# Patient Record
Sex: Female | Born: 1970 | Race: White | Hispanic: No | Marital: Married | Smoking: Never smoker
Health system: Southern US, Community
[De-identification: ages and names within clinical notes are randomized; demographics above are authoritative.]

## PROBLEM LIST (undated history)

## (undated) DIAGNOSIS — G43909 Migraine, unspecified, not intractable, without status migrainosus: Secondary | ICD-10-CM

## (undated) DIAGNOSIS — I10 Essential (primary) hypertension: Secondary | ICD-10-CM

## (undated) HISTORY — PX: ABDOMINAL HYSTERECTOMY: SHX81

---

## 2021-02-17 ENCOUNTER — Other Ambulatory Visit: Payer: Self-pay

## 2021-02-17 ENCOUNTER — Ambulatory Visit
Admission: EM | Admit: 2021-02-17 | Discharge: 2021-02-17 | Disposition: A | Discharge status: Home / Self Care | Payer: BC Managed Care – PPO | Attending: Sports Medicine | Admitting: Sports Medicine

## 2021-02-17 DIAGNOSIS — R0981 Nasal congestion: Secondary | ICD-10-CM

## 2021-02-17 DIAGNOSIS — R519 Headache, unspecified: Secondary | ICD-10-CM

## 2021-02-17 DIAGNOSIS — R059 Cough, unspecified: Secondary | ICD-10-CM | POA: Diagnosis not present

## 2021-02-17 DIAGNOSIS — J069 Acute upper respiratory infection, unspecified: Secondary | ICD-10-CM

## 2021-02-17 DIAGNOSIS — J301 Allergic rhinitis due to pollen: Secondary | ICD-10-CM

## 2021-02-17 DIAGNOSIS — J3489 Other specified disorders of nose and nasal sinuses: Secondary | ICD-10-CM

## 2021-02-17 DIAGNOSIS — M791 Myalgia, unspecified site: Secondary | ICD-10-CM

## 2021-02-17 HISTORY — DX: Migraine, unspecified, not intractable, without status migrainosus: G43.909

## 2021-02-17 HISTORY — DX: Essential (primary) hypertension: I10

## 2021-02-17 MED ORDER — FLUTICASONE PROPIONATE 50 MCG/ACT NA SUSP: 1.0000 | Freq: Every day | NASAL | 2 refills | Status: DC

## 2021-02-17 NOTE — ED Triage Notes (Signed)
Pt reports having headache, runny nose, sinus pressure and cough since Tuesday. Neg home covid test this morning.  Tried using otc meds without relief. Hx of seasonal allergies.

## 2021-02-17 NOTE — Discharge Instructions (Signed)
You have a viral upper respiratory infection.  You do not meet the criteria for a sinus infection.  Complicating your situation is that you have seasonal allergies that are certainly worse at this time of the year. I sent in a nasal steroid to your pharmacy.  I want you to start taking an over-the-counter allergy medication such as Zyrtec, Claritin, or Allegra on a regular basis especially for the next 4 to 6 weeks. Continue with the Nettie pot and saline flushes. I have provided some educational handouts for you to read. I also provided a work note keeping you off today and the weekend. If your symptoms do not improve and you are symptomatic over the next 10 to 14 days there may be an indication to give you an antibiotic.  You are welcome to return here or see your primary care provider for reevaluation.  I hope you get to feeling better, Dr. Zachery Dauer

## 2021-02-19 NOTE — ED Provider Notes (Signed)
[facility]    CSN: [id number] Arrival date & time: 02/17/21  1209      History   Chief Complaint Chief Complaint  Patient presents with  . Facial Pain    HPI Laurie Aguilar is a 50 y.o. female.   Patient is a pleasant 50 year old female who presents for evaluation of the above issue.  She reports 3 days of sinus pressure, facial pain, nasal congestion, rhinorrhea, productive cough, sore throat with coughing, myalgias, chills, and sweats.  No documented fevers.  She does have a history of seasonal allergies.  She has been using over-the-counter decongestion for this.  She is also been using Mucinex.  She did get her flu shot.  She has been vaccinated against COVID and has received the booster.  She did a home test today and it was negative.  She does not want a repeat test in the office.  She denies any nausea vomiting diarrhea.  No urinary or abdominal symptoms.  She denies any chest pain or shortness of breath.  She works at [employer] as a Production designer, theatre/television/film.  Her primary care physician is in [place] at [place].  No red flag signs or symptoms elicited on history.     Past Medical History:  Diagnosis Date  . Hypertension   . Migraine     There are no problems to display for this patient.   Past Surgical History:  Procedure Laterality Date  . ABDOMINAL HYSTERECTOMY      OB History   No obstetric history on file.      Home Medications    Prior to Admission medications   Medication Sig Start Date End Date Taking? Authorizing Provider  FLUoxetine (PROZAC) 20 MG capsule fluoxetine 20 mg capsule 06/24/20  Yes [provider]  fluticasone (FLONASE) 50 MCG/ACT nasal spray Place 1 spray into both nostrils daily. 02/17/21  Yes Delton See, MD  lisinopril (ZESTRIL) 10 MG tablet lisinopril 10 mg tablet 06/13/20  Yes [provider]  estrogens, conjugated, (PREMARIN) 0.625 MG tablet Premarin 0.625 mg tablet    [provider]  simvastatin (ZOCOR) 20 MG  tablet simvastatin 20 mg tablet    [provider]    Family History No family history on file.  Social History Social History   Tobacco Use  . Smoking status: Never Smoker  . Smokeless tobacco: Never Used  Substance Use Topics  . Alcohol use: Not Currently  . Drug use: Not Currently     Allergies   Butalbital-apap-caffeine and Codeine   Review of Systems Review of Systems  Constitutional: Negative.  Negative for activity change, appetite change, chills, diaphoresis and fever.  HENT: Positive for congestion, postnasal drip, rhinorrhea, sinus pressure and sore throat. Negative for facial swelling, sinus pain and sneezing.   Eyes: Negative.  Negative for pain.  Respiratory: Positive for cough.   Cardiovascular: Negative.  Negative for chest pain and palpitations.  Gastrointestinal: Negative.  Negative for abdominal pain, constipation, diarrhea, nausea and vomiting.  Genitourinary: Negative.  Negative for dysuria.  Musculoskeletal: Positive for myalgias. Negative for back pain.  Skin: Negative.  Negative for color change, pallor, rash and wound.  Allergic/Immunologic: Positive for environmental allergies.  Neurological: Positive for headaches. Negative for dizziness, tremors, seizures, syncope, facial asymmetry, weakness, light-headedness and numbness.  All other systems reviewed and are negative.    Physical Exam Triage Vital Signs ED Triage Vitals  Enc Vitals Group     BP 02/17/21 1220 (!) 146/78     Pulse Rate  02/17/21 1220 (!) 105     Resp --      Temp 02/17/21 1220 98.5 F (36.9 C)     Temp Source 02/17/21 1220 Oral     SpO2 02/17/21 1220 100 %     Weight 02/17/21 1221 175 lb (79.4 kg)     Height 02/17/21 1221 5\' 7"  (1.702 m)     Head Circumference --      Peak Flow --      Pain Score 02/17/21 1220 7     Pain Loc --      Pain Edu? --      Excl. in GC? --    No data found.  Updated Vital Signs BP (!) 146/78   Pulse (!) 105   Temp 98.5 F (36.9  C) (Oral)   Ht 5\' 7"  (1.702 m)   Wt 79.4 kg   SpO2 100%   BMI 27.41 kg/m   Visual Acuity Right Eye Distance:   Left Eye Distance:   Bilateral Distance:    Right Eye Near:   Left Eye Near:    Bilateral Near:     Physical Exam Vitals and nursing note reviewed.  Constitutional:      General: She is not in acute distress.    Appearance: Normal appearance. She is not ill-appearing, toxic-appearing or diaphoretic.  HENT:     Head: Normocephalic and atraumatic.     Right Ear: Tympanic membrane normal.     Left Ear: Tympanic membrane normal.     Nose: Congestion and rhinorrhea present.     Mouth/Throat:     Mouth: Mucous membranes are moist.     Pharynx: Posterior oropharyngeal erythema present. No oropharyngeal exudate.  Eyes:     General: No scleral icterus.       Right eye: No discharge.        Left eye: No discharge.     Extraocular Movements: Extraocular movements intact.     Conjunctiva/sclera: Conjunctivae normal.     Pupils: Pupils are equal, round, and reactive to light.  Cardiovascular:     Rate and Rhythm: Normal rate and regular rhythm.     Pulses: Normal pulses.     Heart sounds: Normal heart sounds. No murmur heard. No friction rub. No gallop.   Pulmonary:     Effort: Pulmonary effort is normal. No respiratory distress.     Breath sounds: Normal breath sounds. No stridor. No wheezing, rhonchi or rales.  Musculoskeletal:     Cervical back: Normal range of motion and neck supple. No rigidity or tenderness.  Lymphadenopathy:     Cervical: Cervical adenopathy present.  Skin:    General: Skin is warm.     Capillary Refill: Capillary refill takes less than 2 seconds.     Coloration: Skin is not jaundiced.     Findings: No bruising, erythema, lesion or rash.  Neurological:     General: No focal deficit present.     Mental Status: She is alert and oriented to person, place, and time.      UC Treatments / Results  Labs (all labs ordered are listed, but only  abnormal results are displayed) Labs Reviewed - No data to display  EKG   Radiology No results found.  Procedures Procedures (including critical care time)  Medications Ordered in UC Medications - No data to display  Initial Impression / Assessment and Plan / UC Course  I have reviewed the triage vital signs and the nursing notes.  Pertinent labs &  imaging results that were available during my care of the patient were reviewed by me and considered in my medical decision making (see chart for details).  Clinical impression: Facial pain with sinus pressure and nasal congestion with cough and myalgias.  It seems all consistent with an upper respiratory infection.  She does not meet the criteria for sinusitis just yet.  Complicating her situation is she has seasonal allergies.  Treatment plan: 1.  The findings and treatment plan were discussed in detail with the patient.  Patient was in agreement. 2.  Again no antibiotics indicated as she does not meet criteria for sinusitis. 3.  I felt the best course of action was to at least address her underlying allergies.  I prescribed a nasal steroid.  I want her to continue with over-the-counter allergy medication such as Zyrtec Claritin or Allegra.  Discontinue the decongestant medication as she may get a rebound effect.  I want her to also purchase a Nettie pot and consider doing saline flushes. 4.  Educational handouts were provided. 5.  I gave her a work note keeping her off today and over the weekend as she is a little measurable.  She go back to work on Monday.  She is working with the public so is probably best to just stay home and rest. 6.  Plenty of rest, plenty of fluids, Tylenol or Motrin for any fever or discomfort. 7.  Follow-up as needed.    Final Clinical Impressions(s) / UC Diagnoses   Final diagnoses:  Facial pain  Sinus pressure  Nasal congestion  Cough  Myalgia  Acute upper respiratory infection  Seasonal allergic  rhinitis due to pollen     Discharge Instructions     You have a viral upper respiratory infection.  You do not meet the criteria for a sinus infection.  Complicating your situation is that you have seasonal allergies that are certainly worse at this time of the year. I sent in a nasal steroid to your pharmacy.  I want you to start taking an over-the-counter allergy medication such as Zyrtec, Claritin, or Allegra on a regular basis especially for the next 4 to 6 weeks. Continue with the Nettie pot and saline flushes. I have provided some educational handouts for you to read. I also provided a work note keeping you off today and the weekend. If your symptoms do not improve and you are symptomatic over the next 10 to 14 days there may be an indication to give you an antibiotic.  You are welcome to return here or see your primary care provider for reevaluation.  I hope you get to feeling better, Dr. Zachery Dauer    ED Prescriptions    Medication Sig Dispense Auth. Provider   fluticasone (FLONASE) 50 MCG/ACT nasal spray Place 1 spray into both nostrils daily. 15.8 mL Delton See, MD     PDMP not reviewed this encounter.   Delton See, MD 02/20/21 1406

## 2021-11-08 ENCOUNTER — Ambulatory Visit (INDEPENDENT_AMBULATORY_CARE_PROVIDER_SITE_OTHER): Payer: BC Managed Care – PPO

## 2021-11-08 ENCOUNTER — Other Ambulatory Visit: Payer: Self-pay

## 2021-11-08 ENCOUNTER — Ambulatory Visit
Admission: EM | Admit: 2021-11-08 | Discharge: 2021-11-08 | Disposition: A | Discharge status: Home / Self Care | Payer: BC Managed Care – PPO | Attending: Internal Medicine | Admitting: Internal Medicine

## 2021-11-08 DIAGNOSIS — Z20822 Contact with and (suspected) exposure to covid-19: Secondary | ICD-10-CM | POA: Insufficient documentation

## 2021-11-08 DIAGNOSIS — R509 Fever, unspecified: Secondary | ICD-10-CM

## 2021-11-08 DIAGNOSIS — J101 Influenza due to other identified influenza virus with other respiratory manifestations: Secondary | ICD-10-CM | POA: Diagnosis present

## 2021-11-08 DIAGNOSIS — R059 Cough, unspecified: Secondary | ICD-10-CM

## 2021-11-08 LAB — RESP PANEL BY RT-PCR (FLU A&B, COVID) ARPGX2
Influenza A by PCR: POSITIVE — AB
Influenza B by PCR: NEGATIVE
SARS Coronavirus 2 by RT PCR: NEGATIVE

## 2021-11-08 MED ORDER — BENZONATATE 100 MG PO CAPS: 100.0000 mg | ORAL_CAPSULE | Freq: Three times a day (TID) | ORAL | 0 refills | Status: DC | PRN

## 2021-11-08 MED ORDER — OSELTAMIVIR PHOSPHATE 75 MG PO CAPS: 75.0000 mg | ORAL_CAPSULE | Freq: Two times a day (BID) | ORAL | 0 refills | Status: DC

## 2021-11-08 MED ORDER — IBUPROFEN 600 MG PO TABS: 600.0000 mg | ORAL_TABLET | Freq: Four times a day (QID) | ORAL | 0 refills | Status: DC | PRN

## 2021-11-08 MED ORDER — ACETAMINOPHEN 325 MG PO TABS
650.0000 mg | ORAL_TABLET | Freq: Once | ORAL | Status: AC
  Administered 2021-11-08: 17:00:00 650 mg via ORAL

## 2021-11-08 MED ORDER — ALBUTEROL SULFATE HFA 108 (90 BASE) MCG/ACT IN AERS: 1.0000 | INHALATION_SPRAY | Freq: Four times a day (QID) | RESPIRATORY_TRACT | 0 refills | Status: AC | PRN

## 2021-11-08 NOTE — ED Triage Notes (Signed)
Pt presents with cough and HA x 1 day.  Today started having tightness in lungs and SOB. Took Ibuprofen at 1400.

## 2021-11-08 NOTE — ED Triage Notes (Signed)
Provider notified of VS.

## 2021-11-08 NOTE — Discharge Instructions (Addendum)
Increase oral fluid intake Take medications as prescribed Get some rest next few days If you have worsening symptoms please return to urgent possible reevaluated Chest x-ray is negative for pneumonia.

## 2021-11-09 NOTE — ED Provider Notes (Signed)
[facility]    CSN: [id number] Arrival date & time: 11/08/21  1531      History   Chief Complaint Chief Complaint  Patient presents with   Cough   Shortness of Breath    HPI Laurie Aguilar is a 50 y.o. female comes to the urgent care with 1 day history of cough, sore throat, generalized body aches and a headache.  Patient says symptoms have been worsening over the past day.  She is experiencing chest tightness and shortness of breath.  No nausea or vomiting.  No diarrhea.  Patient woke up this morning with no symptoms.  Patient is vaccinated against COVID-19 virus.  She is not vaccinated against flu this year.  Patient's husband had mild upper respiratory infection symptoms earlier in the week after he returned from [place].  He traveled by air.Marland Kitchen   HPI  Past Medical History:  Diagnosis Date   Hypertension    Migraine     There are no problems to display for this patient.   Past Surgical History:  Procedure Laterality Date   ABDOMINAL HYSTERECTOMY      OB History   No obstetric history on file.      Home Medications    Prior to Admission medications   Medication Sig Start Date End Date Taking? Authorizing Provider  albuterol (VENTOLIN HFA) 108 (90 Base) MCG/ACT inhaler Inhale 1 puff into the lungs every 6 (six) hours as needed for wheezing or shortness of breath. 11/08/21  Yes Estephania Licciardi, Britta Mccreedy, MD  benzonatate (TESSALON) 100 MG capsule Take 1 capsule (100 mg total) by mouth 3 (three) times daily as needed for cough. 11/08/21  Yes Kennis Buell, Britta Mccreedy, MD  estradiol (CLIMARA - DOSED IN MG/24 HR) 0.025 mg/24hr patch Place 0.025 mg onto the skin once a week.   Yes [provider]  ibuprofen (ADVIL) 600 MG tablet Take 1 tablet (600 mg total) by mouth every 6 (six) hours as needed. 11/08/21  Yes Breeonna Mone, Britta Mccreedy, MD  lisinopril (ZESTRIL) 10 MG tablet lisinopril 10 mg tablet 06/13/20  Yes [provider]  oseltamivir (TAMIFLU) 75 MG capsule Take  1 capsule (75 mg total) by mouth every 12 (twelve) hours. 11/08/21  Yes Kurt Hoffmeier, Britta Mccreedy, MD  simvastatin (ZOCOR) 20 MG tablet simvastatin 20 mg tablet   Yes [provider]  FLUoxetine (PROZAC) 20 MG capsule fluoxetine 20 mg capsule 06/24/20   [provider]    Family History History reviewed. No pertinent family history.  Social History Social History   Tobacco Use   Smoking status: Never   Smokeless tobacco: Never  Substance Use Topics   Alcohol use: Not Currently   Drug use: Not Currently     Allergies   Butalbital-apap-caffeine, Codeine, and Other   Review of Systems Review of Systems  Constitutional:  Positive for chills, fatigue and fever.  HENT:  Positive for congestion, rhinorrhea and sore throat.   Respiratory:  Positive for cough. Negative for chest tightness, shortness of breath and wheezing.   Gastrointestinal: Negative.   Musculoskeletal:  Positive for myalgias. Negative for arthralgias.  Neurological:  Positive for headaches.    Physical Exam Triage Vital Signs ED Triage Vitals  Enc Vitals Group     BP 11/08/21 1607 131/73     Pulse Rate 11/08/21 1607 (!) 108     Resp 11/08/21 1607 20     Temp 11/08/21 1607 (!) 101 F (38.3 C)     Temp Source 11/08/21 1607 Oral  SpO2 11/08/21 1607 94 %     Weight --      Height --      Head Circumference --      Peak Flow --      Pain Score 11/08/21 1611 6     Pain Loc --      Pain Edu? --      Excl. in GC? --    No data found.  Updated Vital Signs BP 131/73    Pulse (!) 108    Temp (!) 100.4 F (38 C) (Oral)    Resp 20    SpO2 94%   Visual Acuity Right Eye Distance:   Left Eye Distance:   Bilateral Distance:    Right Eye Near:   Left Eye Near:    Bilateral Near:     Physical Exam Vitals and nursing note reviewed.  Constitutional:      General: She is in acute distress.     Appearance: She is ill-appearing.  Cardiovascular:     Rate and Rhythm: Normal rate and regular  rhythm.  Pulmonary:     Breath sounds: No decreased breath sounds, wheezing, rhonchi or rales.  Neurological:     Mental Status: She is alert.     UC Treatments / Results  Labs (all labs ordered are listed, but only abnormal results are displayed) Labs Reviewed  RESP PANEL BY RT-PCR (FLU A&B, COVID) ARPGX2 - Abnormal; Notable for the following components:      Result Value   Influenza A by PCR POSITIVE (*)    All other components within normal limits    EKG   Radiology DG Chest 2 View  Result Date: 11/08/2021 CLINICAL DATA:  Cough and fevers for 1 day EXAM: CHEST - 2 VIEW COMPARISON:  None. FINDINGS: The heart size and mediastinal contours are within normal limits. Both lungs are clear. The visualized skeletal structures are unremarkable. IMPRESSION: No active cardiopulmonary disease. Electronically Signed   By: Alcide Clever M.D.   On: 11/08/2021 16:59    Procedures Procedures (including critical care time)  Medications Ordered in UC Medications  acetaminophen (TYLENOL) tablet 650 mg (650 mg Oral Given 11/08/21 1635)    Initial Impression / Assessment and Plan / UC Course  I have reviewed the triage vital signs and the nursing notes.  Pertinent labs & imaging results that were available during my care of the patient were reviewed by me and considered in my medical decision making (see chart for details).     1.  Influenza A infection: Chest x-ray is negative for acute lung infiltrate.  Chest x-ray was done because patient's initial pulse oximetry was 94% on room air. Flu PCR was positive for influenza A Tamiflu Albuterol inhaler as needed for chest tightness Tessalon Perles as needed for cough Ibuprofen as needed for fever and/or body aches Maintain adequate hydration Return to urgent care if symptoms worsen. Final Clinical Impressions(s) / UC Diagnoses   Final diagnoses:  Influenza A     Discharge Instructions      Increase oral fluid intake Take  medications as prescribed Get some rest next few days If you have worsening symptoms please return to urgent possible reevaluated Chest x-ray is negative for pneumonia.   ED Prescriptions     Medication Sig Dispense Auth. Provider   albuterol (VENTOLIN HFA) 108 (90 Base) MCG/ACT inhaler Inhale 1 puff into the lungs every 6 (six) hours as needed for wheezing or shortness of breath. 18 g Demaris Callander  O, MD   benzonatate (TESSALON) 100 MG capsule Take 1 capsule (100 mg total) by mouth 3 (three) times daily as needed for cough. 30 capsule Kalinda Romaniello, Britta Mccreedy, MD   ibuprofen (ADVIL) 600 MG tablet Take 1 tablet (600 mg total) by mouth every 6 (six) hours as needed. 30 tablet Jamey Demchak, Britta Mccreedy, MD   oseltamivir (TAMIFLU) 75 MG capsule Take 1 capsule (75 mg total) by mouth every 12 (twelve) hours. 10 capsule Derita Michelsen, Britta Mccreedy, MD      PDMP not reviewed this encounter.   Merrilee Jansky, MD 11/09/21 1128

## 2022-01-27 ENCOUNTER — Other Ambulatory Visit: Payer: Self-pay

## 2022-01-27 ENCOUNTER — Ambulatory Visit
Admission: EM | Admit: 2022-01-27 | Discharge: 2022-01-27 | Disposition: A | Discharge status: Home / Self Care | Payer: BC Managed Care – PPO | Attending: Student | Admitting: Student

## 2022-01-27 ENCOUNTER — Encounter: Payer: Self-pay | Admitting: Emergency Medicine

## 2022-01-27 DIAGNOSIS — G43001 Migraine without aura, not intractable, with status migrainosus: Secondary | ICD-10-CM

## 2022-01-27 MED ORDER — KETOROLAC TROMETHAMINE 30 MG/ML IJ SOLN: 30.0000 mg | Freq: Once | INTRAMUSCULAR | Status: DC

## 2022-01-27 MED ORDER — KETOROLAC TROMETHAMINE 30 MG/ML IJ SOLN
30.0000 mg | Freq: Once | INTRAMUSCULAR | Status: AC
  Administered 2022-01-27: 30 mg via INTRAMUSCULAR

## 2022-01-27 NOTE — ED Triage Notes (Signed)
Patient c/o migraine headache that started last Sunday.  Patient has taken her migraine headache medicine and has not worked.   ?

## 2022-01-27 NOTE — ED Provider Notes (Signed)
?[facility] ? ? ? ?CSN: [id number] ?Arrival date & time: 01/27/22  1452 ? ? ?  ? ?History   ?Chief Complaint ?Chief Complaint  ?Patient presents with  ? Headache  ? ? ?HPI ?Laurie SequinKari Aguilar is a 51 y.o. female presenting with migraine headache. History migraine headaches.  States that she has experienced a migraine for the last 7 days, and she presented to the urgent care today because she needed relief.  She has been taking her prescribed migraine medication without relief.  Has also attempted Tylenol and ibuprofen without relief.  Symptoms consistent with typical migraine symptoms, throbbing frontal headache, tenths cervical paraspinous muscles, photophobia.  Denies worst headache of life, thunderclap headache, vision changes, dizziness, weakness. ? ?HPI ? ?Past Medical History:  ?Diagnosis Date  ? Hypertension   ? Migraine   ? ? ?There are no problems to display for this patient. ? ? ?Past Surgical History:  ?Procedure Laterality Date  ? ABDOMINAL HYSTERECTOMY    ? ? ?OB History   ?No obstetric history on file. ?  ? ? ? ?Home Medications   ? ?Prior to Admission medications   ?Medication Sig Start Date End Date Taking? Authorizing Provider  ?estradiol (CLIMARA - DOSED IN MG/24 HR) 0.025 mg/24hr patch Place 0.025 mg onto the skin once a week.   Yes [provider]  ?FLUoxetine (PROZAC) 20 MG capsule fluoxetine 20 mg capsule 06/24/20  Yes [provider]  ?ibuprofen (ADVIL) 600 MG tablet Take 1 tablet (600 mg total) by mouth every 6 (six) hours as needed. 11/08/21  Yes Lamptey, Britta MccreedyPhilip O, MD  ?lisinopril (ZESTRIL) 10 MG tablet lisinopril 10 mg tablet 06/13/20  Yes [provider]  ?simvastatin (ZOCOR) 20 MG tablet simvastatin 20 mg tablet   Yes [provider]  ?albuterol (VENTOLIN HFA) 108 (90 Base) MCG/ACT inhaler Inhale 1 puff into the lungs every 6 (six) hours as needed for wheezing or shortness of breath. 11/08/21   Merrilee JanskyLamptey, Philip O, MD  ? ? ?Family History ?History  reviewed. No pertinent family history. ? ?Social History ?Social History  ? ?Tobacco Use  ? Smoking status: Never  ? Smokeless tobacco: Never  ?Vaping Use  ? Vaping Use: Never used  ?Substance Use Topics  ? Alcohol use: Not Currently  ? Drug use: Not Currently  ? ? ? ?Allergies   ?Butalbital-apap-caffeine, Codeine, and Other ? ? ?Review of Systems ?Review of Systems  ?Neurological:  Positive for headaches.  ?All other systems reviewed and are negative. ? ? ?Physical Exam ?Triage Vital Signs ?ED Triage Vitals  ?Enc Vitals Group  ?   BP   ?   Pulse   ?   Resp   ?   Temp   ?   Temp src   ?   SpO2   ?   Weight   ?   Height   ?   Head Circumference   ?   Peak Flow   ?   Pain Score   ?   Pain Loc   ?   Pain Edu?   ?   Excl. in GC?   ? ?No data found. ? ?Updated Vital Signs ?BP (!) 150/75 (BP Location: Left Arm)   Pulse 94   Temp 98.6 ?F (37 ?C) (Oral)   Resp 14   Ht 5\' 7"  (1.702 m)   Wt 175 lb 0.7 oz (79.4 kg)   SpO2 97%   BMI 27.42 kg/m?  ? ?Visual Acuity ?Right Eye Distance:   ?  Left Eye Distance:   ?Bilateral Distance:   ? ?Right Eye Near:   ?Left Eye Near:    ?Bilateral Near:    ? ?Physical Exam ?Vitals reviewed.  ?Constitutional:   ?   General: She is not in acute distress. ?   Appearance: Normal appearance. She is not ill-appearing.  ?HENT:  ?   Head: Normocephalic and atraumatic.  ?Eyes:  ?   Extraocular Movements: Extraocular movements intact.  ?   Pupils: Pupils are equal, round, and reactive to light.  ?Cardiovascular:  ?   Rate and Rhythm: Normal rate and regular rhythm.  ?   Heart sounds: Normal heart sounds.  ?Pulmonary:  ?   Effort: Pulmonary effort is normal.  ?   Breath sounds: Normal breath sounds. No wheezing, rhonchi or rales.  ?Musculoskeletal:  ?   Cervical back: Normal range of motion and neck supple. No rigidity.  ?Lymphadenopathy:  ?   Cervical: No cervical adenopathy.  ?Skin: ?   Capillary Refill: Capillary refill takes less than 2 seconds.  ?Neurological:  ?   General: No focal deficit  present.  ?   Mental Status: She is alert and oriented to person, place, and time. Mental status is at baseline.  ?   Cranial Nerves: No cranial nerve deficit or facial asymmetry.  ?   Sensory: Sensation is intact. No sensory deficit.  ?   Motor: Motor function is intact. No weakness.  ?   Coordination: Coordination is intact. Coordination normal.  ?   Gait: Gait is intact. Gait normal.  ?   Comments: PERRLA, EOMI. CN 2-12 intact. No weakness or numbness in UEs or LEs. Normal fingers to thumb. Sensation and strength grossly intact.   ?Psychiatric:     ?   Mood and Affect: Mood normal.     ?   Behavior: Behavior normal.     ?   Thought Content: Thought content normal.     ?   Judgment: Judgment normal.  ? ? ? ?UC Treatments / Results  ?Labs ?(all labs ordered are listed, but only abnormal results are displayed) ?Labs Reviewed - No data to display ? ?EKG ? ? ?Radiology ?No results found. ? ?Procedures ?Procedures (including critical care time) ? ?Medications Ordered in UC ?Medications  ?ketorolac (TORADOL) 30 MG/ML injection 30 mg (30 mg Intramuscular Given 01/27/22 1523)  ? ? ?Initial Impression / Assessment and Plan / UC Course  ?I have reviewed the triage vital signs and the nursing notes. ? ?Pertinent labs & imaging results that were available during my care of the patient were reviewed by me and considered in my medical decision making (see chart for details). ? ?  ? ?This patient is a very pleasant 51 y.o. year old female presenting with migraine headache consistent with past migraine headaches. Reassuring neuro exam ? ?Has already attempted ibuprofen 600 mg 7 hours ago.  She does not have history of kidney disease.  Toradol IM 30 mg administered, she had understands to avoid NSAIDs for 24 hours following this.  Head to the emergency department if new symptoms like vision changes, dizziness, worst headache of life.  She verbalizes understanding and agreement..  ? ?Final Clinical Impressions(s) / UC Diagnoses   ? ?Final diagnoses:  ?Migraine without aura and with status migrainosus, not intractable  ? ? ? ?Discharge Instructions   ? ?  ?-Avoid ibuprofen for 24 hours since we administered Toradol today ?-Head to ED if worst headache of life, new vision changes, dizziness, weakness.  ? ? ? ? ?  ED Prescriptions   ?None ?  ? ?PDMP not reviewed this encounter. ?  ?Rhys Martini, PA-C ?01/27/22 1529 ? ?

## 2022-01-27 NOTE — Discharge Instructions (Addendum)
-  Avoid ibuprofen for 24 hours since we administered Toradol today ?-Head to ED if worst headache of life, new vision changes, dizziness, weakness.  ?

## 2022-04-25 IMAGING — CR DG CHEST 2V
2 series · 2 of 2 positions shown · non-contrast
Comparison: None.

CLINICAL DATA: Cough and fevers for 1 day

EXAM:
CHEST - 2 VIEW

[chest pa]
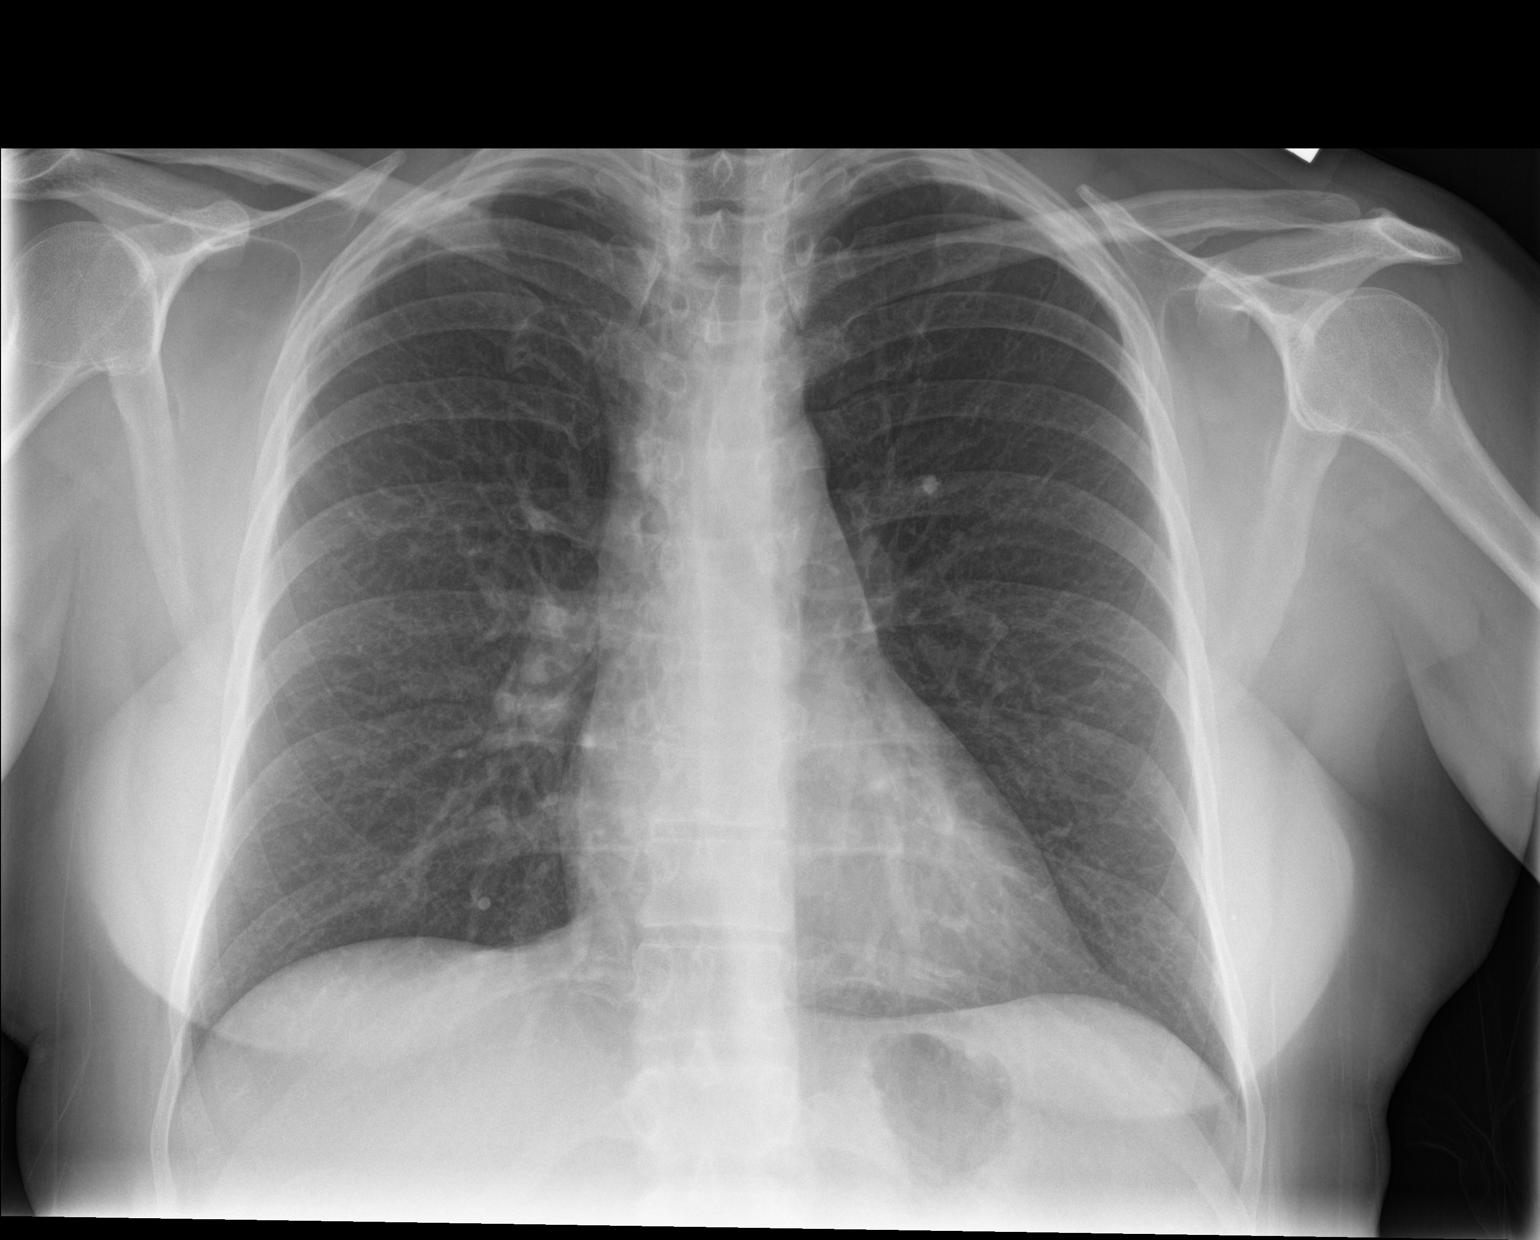

[chest lat]
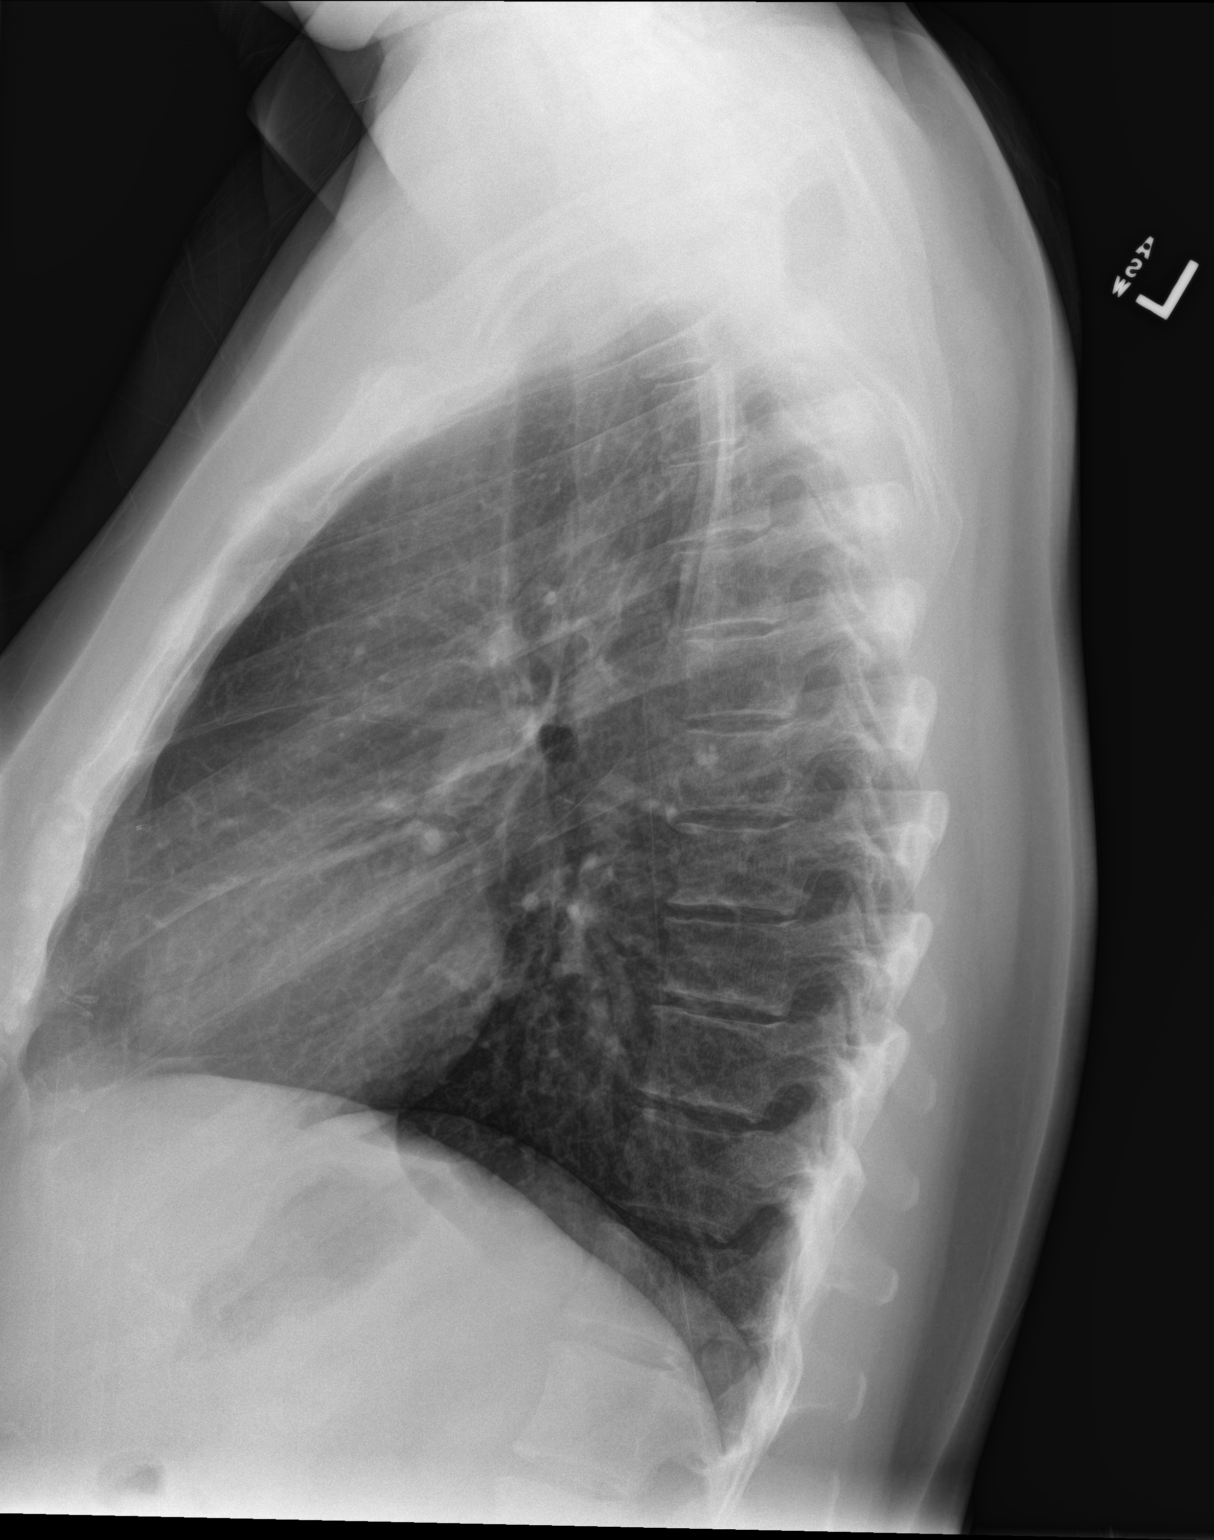

[2 of 2 positions shown; findings below may reference images not displayed]

FINDINGS: The heart size and mediastinal contours are within normal limits.
Both lungs are clear. The visualized skeletal structures are
unremarkable.
IMPRESSION: No active cardiopulmonary disease.

## 2023-02-03 ENCOUNTER — Ambulatory Visit
Admission: EM | Admit: 2023-02-03 | Discharge: 2023-02-03 | Disposition: A | Discharge status: Home / Self Care | Payer: BC Managed Care – PPO | Attending: Emergency Medicine | Admitting: Emergency Medicine

## 2023-02-03 DIAGNOSIS — R319 Hematuria, unspecified: Secondary | ICD-10-CM | POA: Insufficient documentation

## 2023-02-03 DIAGNOSIS — R300 Dysuria: Secondary | ICD-10-CM | POA: Insufficient documentation

## 2023-02-03 LAB — URINALYSIS, W/ REFLEX TO CULTURE (INFECTION SUSPECTED)
Bilirubin Urine: NEGATIVE
Glucose, UA: NEGATIVE mg/dL
Ketones, ur: NEGATIVE mg/dL
Leukocytes,Ua: NEGATIVE
Nitrite: NEGATIVE
Protein, ur: NEGATIVE mg/dL
RBC / HPF: >50 RBC/hpf (ref 0–5)
Specific Gravity, Urine: 1.015 (ref 1.005–1.030)
pH: 6.5 (ref 5.0–8.0)

## 2023-02-03 MED ORDER — CEPHALEXIN 500 MG PO CAPS: 500.0000 mg | ORAL_CAPSULE | Freq: Two times a day (BID) | ORAL | 0 refills | Status: AC

## 2023-02-03 NOTE — ED Provider Notes (Signed)
[facility]    CSN: [id number] Arrival date & time: 02/03/23  1039      History   Chief Complaint Chief Complaint  Patient presents with   Abdominal Pain   Urinary Tract Infection    HPI Laurie Aguilar is a 52 y.o. female.   Patient presents for evaluation of abdominal pain, dysuria, intermittent incomplete bladder emptying and hematuria beginning 1 day ago.  Abdominal pain is present to the bilateral upper quadrants radiating to the left flank, has been constant, intermittent in intensity, rated 8 out of 10, described as cramping,.  Attempted use of Advil which has been ineffective.  History of reoccurring kidney infections.  Denies fevers, urinary frequency or urgency, vaginal symptoms.      Past Medical History:  Diagnosis Date   Hypertension    Migraine     There are no problems to display for this patient.   Past Surgical History:  Procedure Laterality Date   ABDOMINAL HYSTERECTOMY      OB History   No obstetric history on file.      Home Medications    Prior to Admission medications   Medication Sig Start Date End Date Taking? Authorizing Provider  estradiol (CLIMARA - DOSED IN MG/24 HR) 0.025 mg/24hr patch Place 0.025 mg onto the skin once a week.   Yes [provider]  FLUoxetine (PROZAC) 20 MG capsule fluoxetine 20 mg capsule 06/24/20  Yes [provider]  ibuprofen (ADVIL) 600 MG tablet Take 1 tablet (600 mg total) by mouth every 6 (six) hours as needed. 11/08/21  Yes Lamptey, Myrene Galas, MD  lisinopril (ZESTRIL) 10 MG tablet lisinopril 10 mg tablet 06/13/20  Yes [provider]  simvastatin (ZOCOR) 20 MG tablet simvastatin 20 mg tablet   Yes [provider]  albuterol (VENTOLIN HFA) 108 (90 Base) MCG/ACT inhaler Inhale 1 puff into the lungs every 6 (six) hours as needed for wheezing or shortness of breath. 11/08/21   LampteyMyrene Galas, MD    Family History History reviewed. No pertinent family  history.  Social History Social History   Tobacco Use   Smoking status: Never   Smokeless tobacco: Never  Vaping Use   Vaping Use: Never used  Substance Use Topics   Alcohol use: Not Currently   Drug use: Not Currently     Allergies   Butalbital-apap-caffeine, Codeine, and Other   Review of Systems Review of Systems  Constitutional: Negative.   HENT: Negative.    Respiratory: Negative.    Cardiovascular: Negative.   Gastrointestinal:  Positive for abdominal pain. Negative for abdominal distention, anal bleeding, blood in stool, constipation, diarrhea, nausea, rectal pain and vomiting.  Genitourinary:  Positive for dysuria and hematuria. Negative for decreased urine volume, difficulty urinating, dyspareunia, enuresis, flank pain, frequency, genital sores, menstrual problem, pelvic pain, urgency, vaginal bleeding, vaginal discharge and vaginal pain.  Musculoskeletal: Negative.   Skin: Negative.      Physical Exam Triage Vital Signs ED Triage Vitals  Enc Vitals Group     BP 02/03/23 1056 120/66     Pulse Rate 02/03/23 1056 84     Resp --      Temp 02/03/23 1056 98.6 F (37 C)     Temp Source 02/03/23 1056 Oral     SpO2 02/03/23 1056 97 %     Weight 02/03/23 1055 175 lb (79.4 kg)     Height 02/03/23 1055 5\' 7"  (1.702 m)     Head Circumference --  Peak Flow --      Pain Score 02/03/23 1054 8     Pain Loc --      Pain Edu? --      Excl. in [place]? --    No data found.  Updated Vital Signs BP 120/66 (BP Location: Left Arm)   Pulse 84   Temp 98.6 F (37 C) (Oral)   Ht 5\' 7"  (1.702 m)   Wt 175 lb (79.4 kg)   SpO2 97%   BMI 27.41 kg/m   Visual Acuity Right Eye Distance:   Left Eye Distance:   Bilateral Distance:    Right Eye Near:   Left Eye Near:    Bilateral Near:     Physical Exam Constitutional:      Appearance: She is well-developed.  Abdominal:     Tenderness: There is abdominal tenderness in the suprapubic area, left upper quadrant and left  lower quadrant. There is left CVA tenderness.  Neurological:     Mental Status: She is alert.      UC Treatments / Results  Labs (all labs ordered are listed, but only abnormal results are displayed) Labs Reviewed  URINALYSIS, W/ REFLEX TO CULTURE (INFECTION SUSPECTED) - Abnormal; Notable for the following components:      Result Value   Hgb urine dipstick MODERATE (*)    Bacteria, UA FEW (*)    All other components within normal limits    EKG   Radiology No results found.  Procedures Procedures (including critical care time)  Medications Ordered in UC Medications - No data to display  Initial Impression / Assessment and Plan / UC Course  I have reviewed the triage vital signs and the nursing notes.  Pertinent labs & imaging results that were available during my care of the patient were reviewed by me and considered in my medical decision making (see chart for details).  Dysuria, hematuria  Tenderness is present over the abdomen and primarily to the left side of the abdomen radiating to the left flank with CVA tenderness, urinalysis shows hemoglobin and few bacteria, negative for leukocytes and nitrates, based on history sent for culture, discussed all findings with patient, as she is symptomatic we will prophylactically treat, also discussed the possibility of kidney stones as an alternative cause, Keflex prescribed, recommended additional supportive measures through over-the-counter analgesics, Azo and increase fluid intake with good hygiene, recommended reevaluation if symptoms persist past use of medication Final Clinical Impressions(s) / UC Diagnoses   Final diagnoses:  None   Discharge Instructions   None    ED Prescriptions   None    PDMP not reviewed this encounter.   Hans Eden, NP 02/03/23 1131

## 2023-02-03 NOTE — ED Triage Notes (Signed)
Pt c/o possible urinary infection x1day  Pt states that she is having abdominal cramping, urinary burning, and blood in urine.  Pt states that while standing at work she began having intense pain in abdomen and lower back.   Pt has used cranberry juice and advil for the pain.

## 2023-02-03 NOTE — Discharge Instructions (Signed)
Your urinalysis shows blood and a few bacteria but did not show leukocytes which are Taisha[place] Levels blood cells and nitrates which is a enzyme released from back., your urine will be sent to the lab to determine exactly which bacteria is present, if any changes need to be made to your medications you will be notified  Based on your history treating prophylactically as you are symptomatic, take Keflex every morning and every evening for 5 days   You may use over-the-counter Azo to help minimize your symptoms until antibiotic removes bacteria, this medication will turn your urine orange  Increase your fluid intake through use of water  As always practice good hygiene, wiping front to back and avoidance of scented vaginal products to prevent further irritation  If symptoms continue to persist after use of medication or recur please follow-up with urgent care or your primary doctor as needed

## 2023-02-04 LAB — URINE CULTURE: Culture: NO GROWTH

## 2023-08-04 ENCOUNTER — Ambulatory Visit (INDEPENDENT_AMBULATORY_CARE_PROVIDER_SITE_OTHER): Payer: BC Managed Care – PPO

## 2023-08-04 ENCOUNTER — Ambulatory Visit
Admission: EM | Admit: 2023-08-04 | Discharge: 2023-08-04 | Disposition: A | Discharge status: Home / Self Care | Payer: BC Managed Care – PPO | Attending: Physician Assistant | Admitting: Physician Assistant

## 2023-08-04 DIAGNOSIS — R319 Hematuria, unspecified: Secondary | ICD-10-CM

## 2023-08-04 DIAGNOSIS — N201 Calculus of ureter: Secondary | ICD-10-CM

## 2023-08-04 DIAGNOSIS — R109 Unspecified abdominal pain: Secondary | ICD-10-CM | POA: Diagnosis present

## 2023-08-04 DIAGNOSIS — N200 Calculus of kidney: Secondary | ICD-10-CM | POA: Diagnosis present

## 2023-08-04 LAB — CBC WITH DIFFERENTIAL/PLATELET
Abs Immature Granulocytes: 0.01 10*3/uL (ref 0.00–0.07)
Basophils Absolute: 0.1 10*3/uL (ref 0.0–0.1)
Basophils Relative: 1 %
Eosinophils Absolute: 0.1 10*3/uL (ref 0.0–0.5)
Eosinophils Relative: 2 %
HCT: 39.9 % (ref 36.0–46.0)
Hemoglobin: 13.4 g/dL (ref 12.0–15.0)
Immature Granulocytes: 0 %
Lymphocytes Relative: 20 %
Lymphs Abs: 1.2 10*3/uL (ref 0.7–4.0)
MCH: 30 pg (ref 26.0–34.0)
MCHC: 33.6 g/dL (ref 30.0–36.0)
MCV: 89.5 fL (ref 80.0–100.0)
Monocytes Absolute: 0.5 10*3/uL (ref 0.1–1.0)
Monocytes Relative: 8 %
Neutro Abs: 4.1 10*3/uL (ref 1.7–7.7)
Neutrophils Relative %: 69 %
Platelets: 280 10*3/uL (ref 150–400)
RBC: 4.46 MIL/uL (ref 3.87–5.11)
RDW: 12.7 % (ref 11.5–15.5)
WBC: 6 10*3/uL (ref 4.0–10.5)
nRBC: 0 % (ref 0.0–0.2)

## 2023-08-04 LAB — COMPREHENSIVE METABOLIC PANEL
ALT: 36 U/L (ref 0–44)
AST: 24 U/L (ref 15–41)
Albumin: 4 g/dL (ref 3.5–5.0)
Alkaline Phosphatase: 52 U/L (ref 38–126)
Anion gap: 9 (ref 5–15)
BUN: 17 mg/dL (ref 6–20)
CO2: 27 mmol/L (ref 22–32)
Calcium: 8.6 mg/dL — ABNORMAL LOW (ref 8.9–10.3)
Chloride: 101 mmol/L (ref 98–111)
Creatinine, Ser: 0.64 mg/dL (ref 0.44–1.00)
GFR, Estimated: >60 mL/min (ref >60)
Glucose, Bld: 107 mg/dL — ABNORMAL HIGH (ref 70–99)
Potassium: 3.9 mmol/L (ref 3.5–5.1)
Sodium: 137 mmol/L (ref 135–145)
Total Bilirubin: 0.7 mg/dL (ref 0.3–1.2)
Total Protein: 7.4 g/dL (ref 6.5–8.1)

## 2023-08-04 LAB — URINALYSIS, W/ REFLEX TO CULTURE (INFECTION SUSPECTED)
Bilirubin Urine: NEGATIVE
Glucose, UA: NEGATIVE mg/dL
Ketones, ur: NEGATIVE mg/dL
Leukocytes,Ua: NEGATIVE
Nitrite: NEGATIVE
Protein, ur: NEGATIVE mg/dL
Specific Gravity, Urine: 1.015 (ref 1.005–1.030)
pH: 6 (ref 5.0–8.0)

## 2023-08-04 MED ORDER — KETOROLAC TROMETHAMINE 10 MG PO TABS: 10.0000 mg | ORAL_TABLET | Freq: Four times a day (QID) | ORAL | 0 refills | Status: AC | PRN

## 2023-08-04 MED ORDER — TAMSULOSIN HCL 0.4 MG PO CAPS: 0.4000 mg | ORAL_CAPSULE | Freq: Every day | ORAL | 0 refills | Status: AC

## 2023-08-04 MED ORDER — KETOROLAC TROMETHAMINE 30 MG/ML IJ SOLN
30.0000 mg | Freq: Once | INTRAMUSCULAR | Status: AC
  Administered 2023-08-04: 30 mg via INTRAMUSCULAR

## 2023-08-04 NOTE — ED Provider Notes (Signed)
[facility]    CSN: [id number] Arrival date & time: 08/04/23  0908      History   Chief Complaint Chief Complaint  Patient presents with   Urinary Tract Infection    HPI Laurie Aguilar is a 52 y.o. female presenting for approximately 1 week history of left flank pain that is constant and worsening.  Also reports hematuria until today.  States urine has been yellow today.  Denies if fever, vomiting.  States she has had a little bit of burning with urination and frequency today.  She believes she has a kidney infection.  She reports a long history of kidney stones and has had prior lithotripsy x 4.  She reports she has a follow-up appointment with her urologist in 2 days but wanted to be checked for urinary infection today.  Has taken OTC meds.  She is rating her current pain at 8 out of 10.  Pain does radiate to left lower abdomen.  She is being seen by PT for muscle spasms and pelvic pain on the left side and saw physical therapist the day the symptoms began.  States she had a deep massage that day and then noticed blood in urine afterwards.  HPI  Past Medical History:  Diagnosis Date   Hypertension    Migraine     There are no problems to display for this patient.   Past Surgical History:  Procedure Laterality Date   ABDOMINAL HYSTERECTOMY      OB History   No obstetric history on file.      Home Medications    Prior to Admission medications   Medication Sig Start Date End Date Taking? Authorizing Provider  dicyclomine (BENTYL) 10 MG capsule Take 10 mg by mouth every 6 (six) hours as needed.   Yes [provider]  estradiol (CLIMARA - DOSED IN MG/24 HR) 0.025 mg/24hr patch Place 0.025 mg onto the skin once a week.   Yes [provider]  FLUoxetine (PROZAC) 20 MG capsule fluoxetine 20 mg capsule 06/24/20  Yes [provider]  gabapentin (NEURONTIN) 300 MG capsule Take by mouth. 05/25/22  Yes [provider]  ketorolac  (TORADOL) 10 MG tablet Take 1 tablet (10 mg total) by mouth every 6 (six) hours as needed. 08/04/23  Yes Eusebio Friendly B, PA-C  lisinopril (ZESTRIL) 10 MG tablet lisinopril 10 mg tablet 06/13/20  Yes [provider]  simvastatin (ZOCOR) 20 MG tablet simvastatin 20 mg tablet   Yes [provider]  tamsulosin (FLOMAX) 0.4 MG CAPS capsule Take 1 capsule (0.4 mg total) by mouth daily. 08/04/23  Yes Shirlee Latch, PA-C  albuterol (VENTOLIN HFA) 108 (90 Base) MCG/ACT inhaler Inhale 1 puff into the lungs every 6 (six) hours as needed for wheezing or shortness of breath. 11/08/21   Merrilee Jansky, MD    Family History No family history on file.  Social History Social History   Tobacco Use   Smoking status: Never   Smokeless tobacco: Never  Vaping Use   Vaping status: Never Used  Substance Use Topics   Alcohol use: Not Currently   Drug use: Not Currently     Allergies   Butalbital-apap-caffeine, Codeine, Other, and Penicillins   Review of Systems Review of Systems  Constitutional:  Negative for chills, fatigue and fever.  Gastrointestinal:  Positive for abdominal pain and nausea. Negative for diarrhea and vomiting.  Genitourinary:  Positive for dysuria, flank pain, frequency, pelvic pain and urgency. Negative for decreased  urine volume, hematuria, vaginal bleeding, vaginal discharge and vaginal pain.  Musculoskeletal:  Positive for back pain.  Skin:  Negative for rash.     Physical Exam Triage Vital Signs ED Triage Vitals  Encounter Vitals Group     BP      Systolic BP Percentile      Diastolic BP Percentile      Pulse      Resp      Temp      Temp src      SpO2      Weight      Height      Head Circumference      Peak Flow      Pain Score      Pain Loc      Pain Education      Exclude from Growth Chart    No data found.  Updated Vital Signs BP 133/75 (BP Location: Left Arm)   Pulse 77   Temp 98.4 F (36.9 C) (Oral)   Ht 5\' 7"  (1.702 m)    Wt 170 lb (77.1 kg)   SpO2 98%   BMI 26.63 kg/m     Physical Exam Vitals and nursing note reviewed.  Constitutional:      General: She is not in acute distress.    Appearance: Normal appearance. She is not ill-appearing or toxic-appearing.  HENT:     Head: Normocephalic and atraumatic.  Eyes:     General: No scleral icterus.       Right eye: No discharge.        Left eye: No discharge.     Conjunctiva/sclera: Conjunctivae normal.  Cardiovascular:     Rate and Rhythm: Normal rate and regular rhythm.     Heart sounds: Normal heart sounds.  Pulmonary:     Effort: Pulmonary effort is normal. No respiratory distress.     Breath sounds: Normal breath sounds.  Abdominal:     Palpations: Abdomen is soft.     Tenderness: There is no abdominal tenderness. There is left CVA tenderness. There is no right CVA tenderness.  Musculoskeletal:     Cervical back: Neck supple.  Skin:    General: Skin is dry.  Neurological:     General: No focal deficit present.     Mental Status: She is alert. Mental status is at baseline.     Motor: No weakness.     Gait: Gait normal.  Psychiatric:        Mood and Affect: Mood normal.        Behavior: Behavior normal.        Thought Content: Thought content normal.      UC Treatments / Results  Labs (all labs ordered are listed, but only abnormal results are displayed) Labs Reviewed  URINALYSIS, W/ REFLEX TO CULTURE (INFECTION SUSPECTED) - Abnormal; Notable for the following components:      Result Value   Hgb urine dipstick SMALL (*)    Bacteria, UA RARE (*)    All other components within normal limits  COMPREHENSIVE METABOLIC PANEL - Abnormal; Notable for the following components:   Glucose, Bld 107 (*)    Calcium 8.6 (*)    All other components within normal limits  CBC WITH DIFFERENTIAL/PLATELET    EKG   Radiology DG Abdomen 1 View  Result Date: 08/04/2023 CLINICAL DATA:  Left flank pain and hematuria 1 week. History of kidney  stones. Possible left kidney infection. EXAM: ABDOMEN - 1 VIEW COMPARISON:  None Available. FINDINGS: Bowel gas pattern is nonobstructive. Several calcifications project over the left kidney likely renal stones. There is a 4 mm calcification projecting over the region of the left UPJ possibly a stone. There is also a 4 mm calcification projecting over the course of the right mid ureter between the L3 and L4 transverse processes which may represent a ureteral stone. No definite calcifications over the right kidney. No concerning calcifications over the pelvis. Remaining bones and soft tissues are unremarkable. IMPRESSION: 1. Nonobstructive bowel gas pattern. 2. Left nephrolithiasis with possible 4 mm left UPJ stone and possible 4 mm right mid ureteral stone. Electronically Signed   By: Elberta Fortis M.D.   On: 08/04/2023 10:07    Procedures Procedures (including critical care time)  Medications Ordered in UC Medications  ketorolac (TORADOL) 30 MG/ML injection 30 mg (30 mg Intramuscular Given 08/04/23 1046)    Initial Impression / Assessment and Plan / UC Course  I have reviewed the triage vital signs and the nursing notes.  Pertinent labs & imaging results that were available during my care of the patient were reviewed by me and considered in my medical decision making (see chart for details).   52 year old female with self-reported history of nephrolithiasis, your tract infections and previous lithotripsy presents for left flank pain, hematuria x 1 week.  Reports dysuria beginning today.  Symptoms began after she saw physical therapy for pain of the back on the left side and pain in pelvic area.  Reports having a deep tissue massage that day.  Has an appointment with her urologist in 2 days.  Normal and stable and she is overall well-appearing.  On exam she has left CVA tenderness.  Chest is clear.  Urinalysis shows small hemoglobin but not consistent with urinary tract infection.  Will obtain KUB  to assess for possible kidney stone and CBC/CMP for any signs of infection or kidney injury.  KUB shows two 4 mm stones.  1 is near the UPJ and the other is in the mid right ureter.   CBC is within normal limits. Normal creatinine and GFR.   Discussed all results with patient. Patient given 30 mg IM ketorolac and I sent this oral medicine to pharmacy PRN. Will start patient on tamsulosin.  Given strainer.  Encouraged her to increase her fluid intake and rest.  Advised to keep follow-up appointment with urologist in a couple days but go to emergency department if she has fever or any worsening of symptoms.   Final Clinical Impressions(s) / UC Diagnoses   Final diagnoses:  Hematuria, unspecified type  Left flank pain  Ureterolithiasis     Discharge Instructions      -Urine test is not consistent with urinary infection. -The x-ray shows a left sided 4 mm kidney stones of the low back near the pelvic junction. Larina Bras is said to be non-obstructing. -I have sent Tamsulosin to pharmacy to help open the ureter and aid in the stone passing. Increase fluids. -Keep follow up appt with urologist. -Go to ED if fever, worsening pain, weakness.     ED Prescriptions     Medication Sig Dispense Auth. Provider   tamsulosin (FLOMAX) 0.4 MG CAPS capsule Take 1 capsule (0.4 mg total) by mouth daily. 30 capsule Eusebio Friendly B, PA-C   ketorolac (TORADOL) 10 MG tablet Take 1 tablet (10 mg total) by mouth every 6 (six) hours as needed. 20 tablet Shirlee Latch, PA-C      I have reviewed the  PDMP during this encounter.   Shirlee Latch, PA-C 08/04/23 1050

## 2023-08-04 NOTE — ED Triage Notes (Signed)
Pt is with her husband  Pt c/o possible kidney infection on the left side x2weeks  Pt states that she went to PT and had a massage on the left side and is now peeing blood.   Pt states her urine is "like ice tea" and "pinky red".

## 2023-08-04 NOTE — Discharge Instructions (Addendum)
-  Urine test is not consistent with urinary infection. -The x-ray shows a left sided 4 mm kidney stones of the low back near the pelvic junction. Laurie Aguilar is said to be non-obstructing. -I have sent Tamsulosin to pharmacy to help open the ureter and aid in the stone passing. Increase fluids. -Keep follow up appt with urologist. -Go to ED if fever, worsening pain, weakness.
